# Patient Record
Sex: Male | Born: 1990 | Race: White | Hispanic: No | Marital: Single | Smoking: Current every day smoker
Health system: Southern US, Community
[De-identification: ages and names within clinical notes are randomized; demographics above are authoritative.]

## PROBLEM LIST (undated history)

## (undated) HISTORY — PX: DENTAL SURGERY: SHX609

---

## 2013-01-18 ENCOUNTER — Ambulatory Visit: Payer: Self-pay | Admitting: Urology

## 2013-01-29 ENCOUNTER — Emergency Department: Payer: Self-pay | Admitting: Emergency Medicine

## 2013-01-29 LAB — URINALYSIS, COMPLETE
Bilirubin,UR: NEGATIVE
Leukocyte Esterase: NEGATIVE
Nitrite: NEGATIVE
Ph: 7 (ref 4.5–8.0)
Protein: NEGATIVE
RBC,UR: 17 /HPF (ref 0–5)
Specific Gravity: 1.017 (ref 1.003–1.030)
Squamous Epithelial: NONE SEEN

## 2014-07-29 NOTE — H&P (Signed)
PATIENT NAME:  Vincent Maddox, Vincent Maddox MR#:  [id number] DATE OF BIRTH:  May 30, 1990  DATE OF ADMISSION:  01/18/2013  CHIEF COMPLAINT: Difficulty voiding and hematuria.   HISTORY OF PRESENT ILLNESS: Mr. Vincent Maddox is a 24 year old white male, who developed gross hematuria in August and difficulty voiding. He has had a greater than 1-year history of intermittent dysuria, particularly after sexual activity. He has significant lower urinary tract symptoms with an AUA symptom score of 26 and a quality of life score of 4. 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 He comes in now for cystoscopy with visual internal urethrotomy.   ALLERGIES: No drug allergies.   MEDICATIONS: No medications.   PAST SURGICAL HISTORY: No previous surgical procedures.   SOCIAL HISTORY: He smokes a pack a day and has a 15 pack-year history. He consumes 15 to 20 alcoholic beverages per week.   FAMILY HISTORY: Remarkable for parents with thyroid disease.   PAST AND CURRENT MEDICAL CONDITIONS:  1.  Attention deficit disorder.  2.  Anxiety.   REVIEW OF SYSTEMS: The patient occasionally has double vision. He has had intermittent low back pain for a year. He denied chest pain, shortness of breath, diabetes, stroke, heart disease or hypertension.   PHYSICAL EXAMINATION:  GENERAL: Thin white male in no acute distress.  HEENT: Sclerae were clear. Pupils were equally round and reactive to light and accommodation.  NECK: Supple. No palpable cervical adenopathy.  LUNGS: Clear to auscultation.  HEART: Regular rhythm and rate without audible murmurs.  ABDOMEN: Soft, nontender abdomen.  GENITOURINARY: Circumcised. Testes smooth, nontender, 20 mL in size each.  RECTAL: 15 gram smooth nontender prostate.  NEUROMUSCULAR: Alert and oriented x 3.   IMPRESSION: Urethral stricture.   PLAN: Cystoscopy with visual internal urethrotomy.   ____________________________ Suszanne ConnersMichael R. Evelene CroonWolff,  MD mrw:aw D: 01/13/2013 09:48:44 ET T: 01/13/2013 09:56:44 ET JOB#: [id number]  cc: Suszanne ConnersMichael R. Evelene CroonWolff, MD, <Dictator> Orson ApeMICHAEL R Zacchaeus Halm MD ELECTRONICALLY SIGNED 01/13/2013 13:35

## 2014-07-29 NOTE — Op Note (Signed)
PATIENT NAME:  Vincent Maddox, Ever MR#:  [id number] DATE OF BIRTH:  06-Feb-1991  DATE OF PROCEDURE:  01/18/2013  PREOPERATIVE DIAGNOSIS: Urethral stricture disease.   POSTOPERATIVE DIAGNOSIS; Urethral stricture disease.  PROCEDURE: Cystoscopy with internal urethrotomy using the holmium laser.   SURGEON: Anola GurneyMichael Deyja Sochacki, M.D.   ANESTHETIST: Dr. Pernell DupreAdams and Dr. Evelene CroonWolff.  ANESTHETIC METHOD: General per Dr. Pernell DupreAdams and local per Dr. Evelene CroonWolff.   INDICATIONS: See the dictated history and physical. After informed consent, the patient requests the above procedure.   OPERATIVE SUMMARY: After adequate general anesthesia had been obtained, the patient was placed into dorsal lithotomy position and the perineum was prepped and draped in the usual fashion. The 21-French cystoscope was coupled to the camera and then visually advanced into the urethra. A stricture was encountered at the distal bulbar urethra. The stricture extended to the level of the external sphincter. The stricture did not allow passage of the 21 scope through the stricture. At this point, a 0.035 guidewire was advanced through the scope and through the stricture and curled into the bladder. The 550 micron side-fire holmium laser fiber was then introduced through the scope and the strictures incised at the 12 o'clock position, eventually allowing passage of the scope into the bladder. The bladder was then thoroughly inspected. Both ureteral orifices were identified and had clear efflux. No bladder mucosal lesions  were identified. At this point, the scope was removed taking care of the guidewire in position. 10 mL of viscous Xylocaine was instilled within the urethra and the bladder. A 20-French Council catheter was advanced over the guidewire and positioned into the bladder. Guidewire was then removed. The catheter had clear drainage. A B and O suppository was placed. The procedure was then terminated and the patient was transferred to the recovery room in stable  condition.  ____________________________ Suszanne ConnersMichael R. Evelene CroonWolff, MD mrw:aw D: 01/18/2013 11:54:10 ET T: 01/18/2013 12:15:47 ET JOB#: [id number]  cc: Suszanne ConnersMichael R. Evelene CroonWolff, MD, <Dictator> Orson ApeMICHAEL R Yerlin Gasparyan MD ELECTRONICALLY SIGNED 01/19/2013 14:20

## 2014-10-07 IMAGING — US US PELVIS LIMITED
1 series · 14 of 25 positions shown · non-contrast
Comparison: none

REASON FOR EXAM: testicular pain s/p procedure last week, evaluate for
blood flow
COMMENTS:

PROCEDURE:     US  - US TESTICULAR  - January 29, 2013  [DATE]
RESULT:     History: Pain.
Comparison Study: No prior.

[Series 1: us pelvis limited · 0.08mm/px · 14 of 45 slices shown]
[im 1/45]
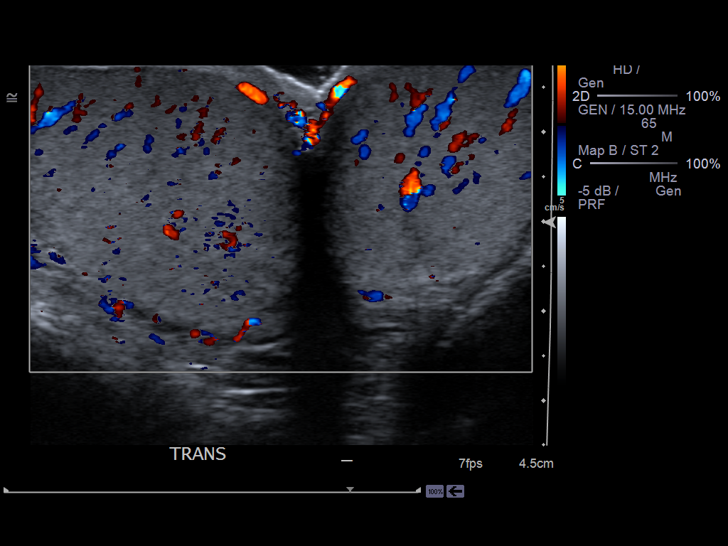
[im 4/45]
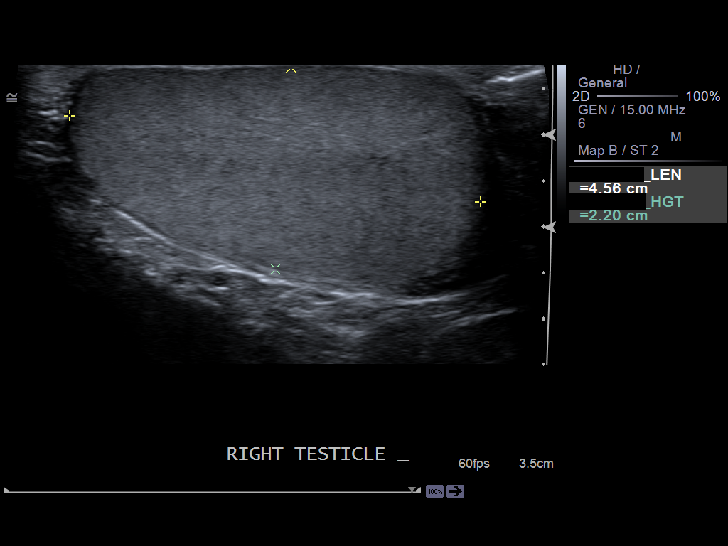
[im 8/45]
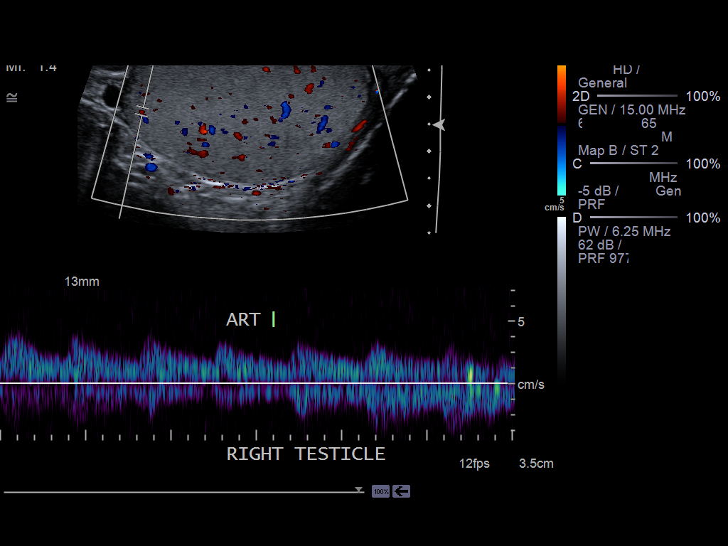
[im 12/45]
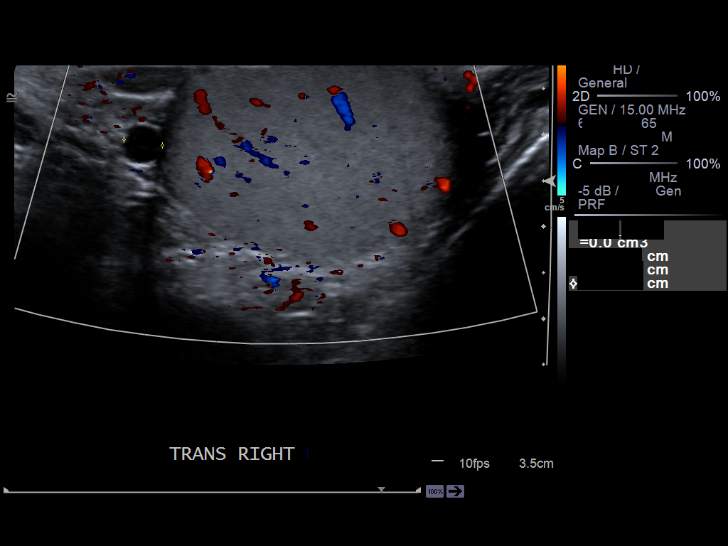
[im 15/45]
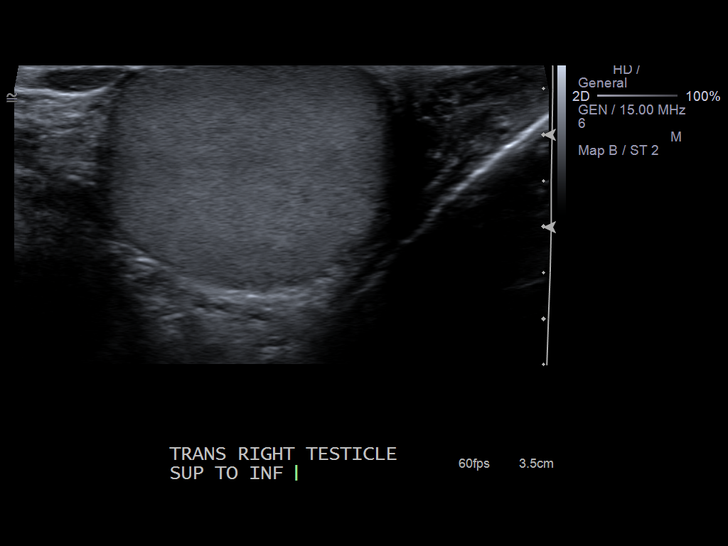
[im 17/45]
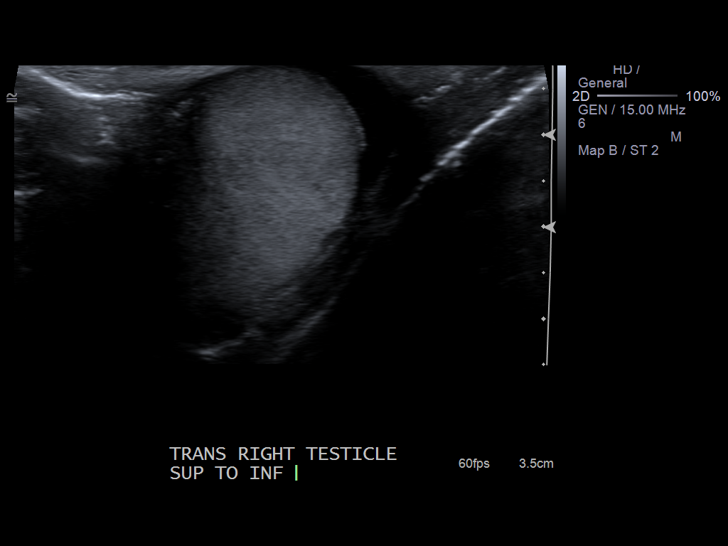
[im 21/45]
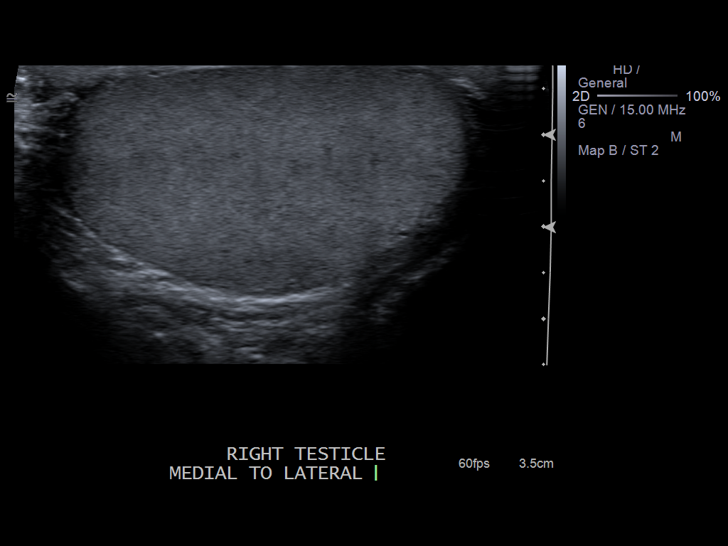
[im 24/45]
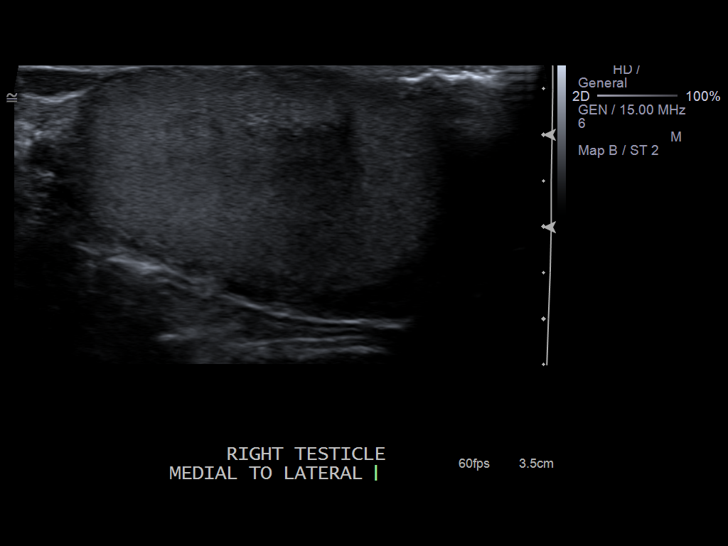
[im 28/45]
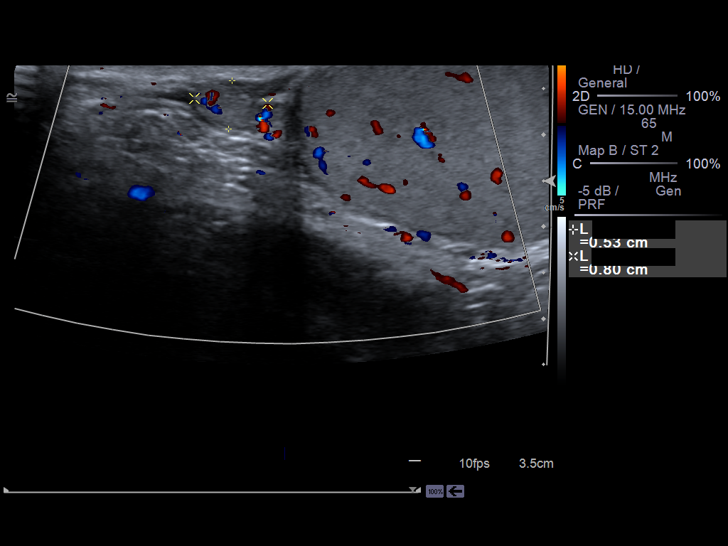
[im 30/45]
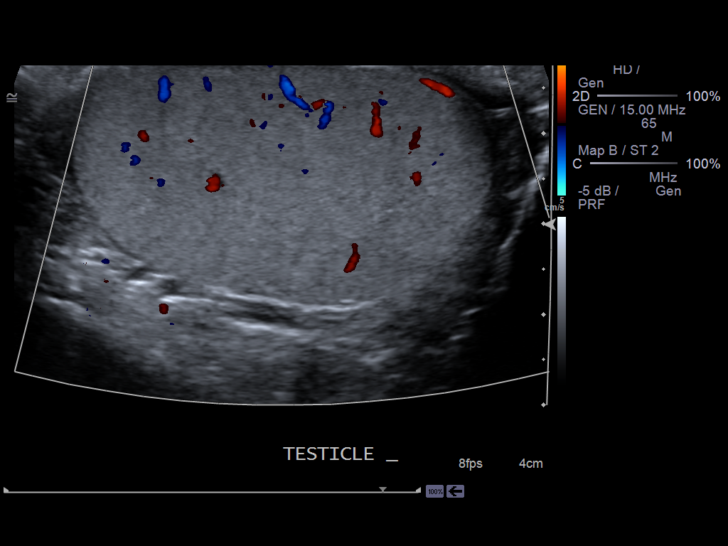
[im 34/45]
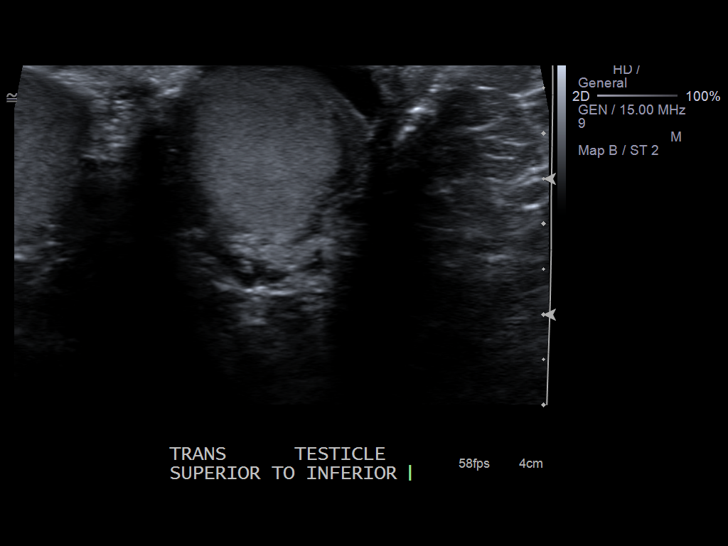
[im 37/45]
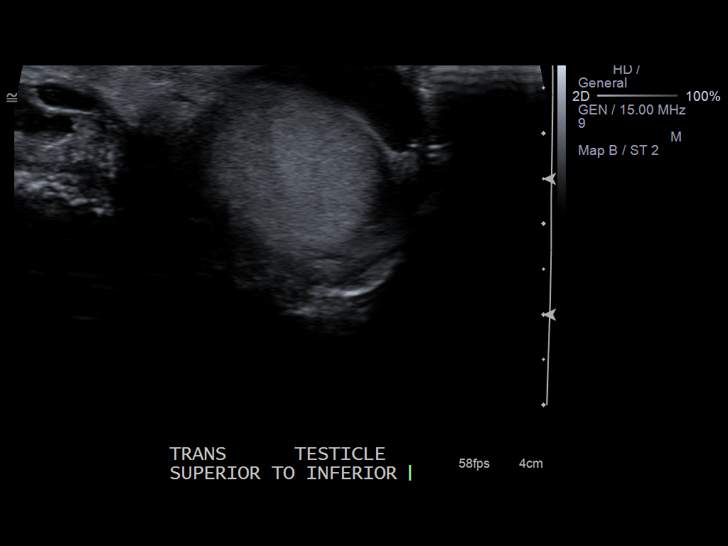
[im 41/45]
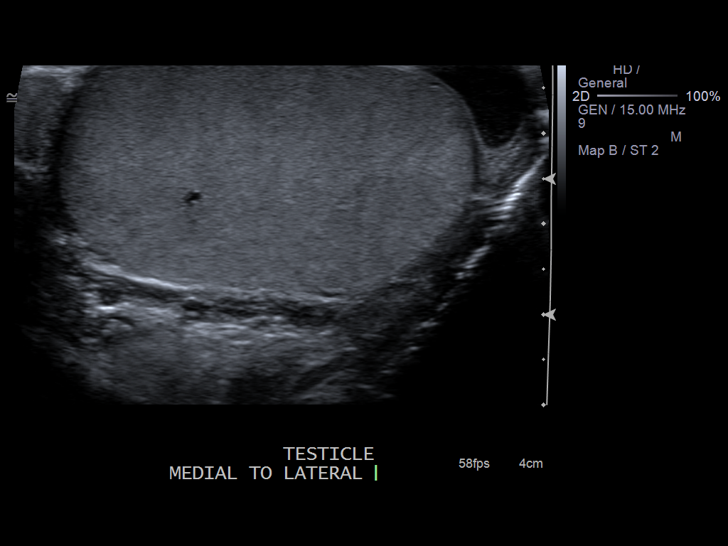
[im 45/45]
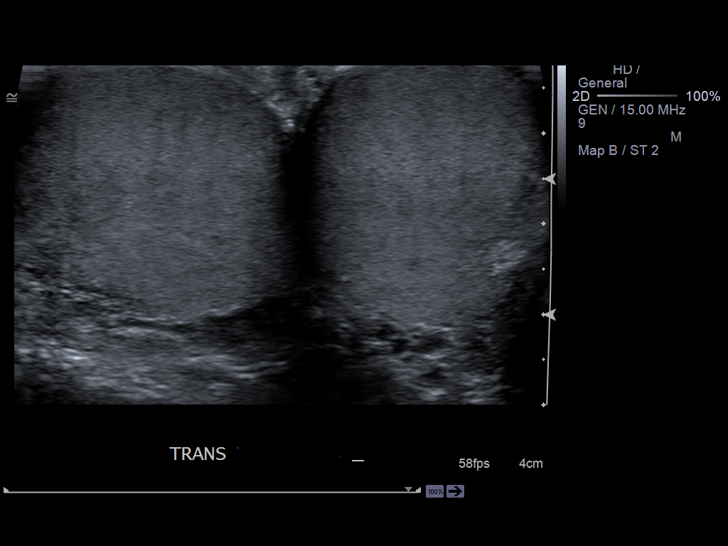

[14 of 25 positions shown; findings below may reference images not displayed]

FINDINGS: Standard testicular ultrasound with Doppler analysis performed.

Right testicle measures 4.6 cm in longitudinal length and appears normal. A
4.4 mm epididymal cyst is present. Epididymis otherwise unremarkable. Normal
testicular flow.

Left testicle measures 4.6 arteries longitudinal length and appears normal.
Small varicocele is present. Epididymis is normal. Normal flow present.
IMPRESSION: Tiny epididymal cyst on the right. Small varicocele on the
left. No torsion.

## 2015-12-23 ENCOUNTER — Encounter: Payer: Self-pay | Admitting: Emergency Medicine

## 2015-12-23 ENCOUNTER — Emergency Department: Payer: Self-pay

## 2015-12-23 ENCOUNTER — Emergency Department
Admission: EM | Admit: 2015-12-23 | Discharge: 2015-12-23 | Disposition: A | Discharge status: Home / Self Care | Payer: Self-pay | Attending: Emergency Medicine | Admitting: Emergency Medicine

## 2015-12-23 DIAGNOSIS — Y288XXA Contact with other sharp object, undetermined intent, initial encounter: Secondary | ICD-10-CM | POA: Insufficient documentation

## 2015-12-23 DIAGNOSIS — S41111A Laceration without foreign body of right upper arm, initial encounter: Secondary | ICD-10-CM

## 2015-12-23 DIAGNOSIS — F17200 Nicotine dependence, unspecified, uncomplicated: Secondary | ICD-10-CM | POA: Insufficient documentation

## 2015-12-23 DIAGNOSIS — Y9389 Activity, other specified: Secondary | ICD-10-CM | POA: Insufficient documentation

## 2015-12-23 DIAGNOSIS — Y999 Unspecified external cause status: Secondary | ICD-10-CM | POA: Insufficient documentation

## 2015-12-23 DIAGNOSIS — Z23 Encounter for immunization: Secondary | ICD-10-CM | POA: Insufficient documentation

## 2015-12-23 DIAGNOSIS — S51811A Laceration without foreign body of right forearm, initial encounter: Secondary | ICD-10-CM | POA: Insufficient documentation

## 2015-12-23 DIAGNOSIS — Y92018 Other place in single-family (private) house as the place of occurrence of the external cause: Secondary | ICD-10-CM | POA: Insufficient documentation

## 2015-12-23 MED ORDER — ONDANSETRON 4 MG PO TBDP
4.0000 mg | ORAL_TABLET | Freq: Once | ORAL | Status: AC
  Administered 2015-12-23: 4 mg via ORAL
  Filled 2015-12-23: qty 1

## 2015-12-23 MED ORDER — LIDOCAINE-EPINEPHRINE (PF) 1 %-1:200000 IJ SOLN
10.0000 mL | Freq: Once | INTRAMUSCULAR | Status: AC
  Administered 2015-12-23: 10 mL
  Filled 2015-12-23: qty 30

## 2015-12-23 MED ORDER — TETANUS-DIPHTH-ACELL PERTUSSIS 5-2.5-18.5 LF-MCG/0.5 IM SUSP
0.5000 mL | Freq: Once | INTRAMUSCULAR | Status: AC
  Administered 2015-12-23: 0.5 mL via INTRAMUSCULAR
  Filled 2015-12-23: qty 0.5

## 2015-12-23 MED ORDER — HYDROCODONE-ACETAMINOPHEN 5-325 MG PO TABS: 1.0000 | ORAL_TABLET | Freq: Four times a day (QID) | ORAL | 0 refills | Status: DC | PRN

## 2015-12-23 MED ORDER — SULFAMETHOXAZOLE-TRIMETHOPRIM 800-160 MG PO TABS: 1.0000 | ORAL_TABLET | Freq: Two times a day (BID) | ORAL | 0 refills | Status: DC
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
## 2015-12-23 NOTE — ED Notes (Signed)
Pt verbalized understanding of discharge instructions. NAD at this time. 
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
## 2015-12-23 NOTE — Discharge Instructions (Signed)
Keep wounds clean and dry for 48 hours then may cleanse per usual.

## 2016-01-04 DIAGNOSIS — Z7185 Encounter for immunization safety counseling: Secondary | ICD-10-CM | POA: Insufficient documentation

## 2017-08-30 IMAGING — CR DG FOREARM 2V*R*
1 series · 2 of 2 positions shown · non-contrast
Comparison: None.

CLINICAL DATA: Laceration injury to right forearm. Evaluate for
foreign bodies. Initial encounter.

EXAM:
RIGHT FOREARM - 2 VIEW

[Series 1: x forearm ap right · 0.14mm/px · 2 of 2 slices shown]
[im 1/2]
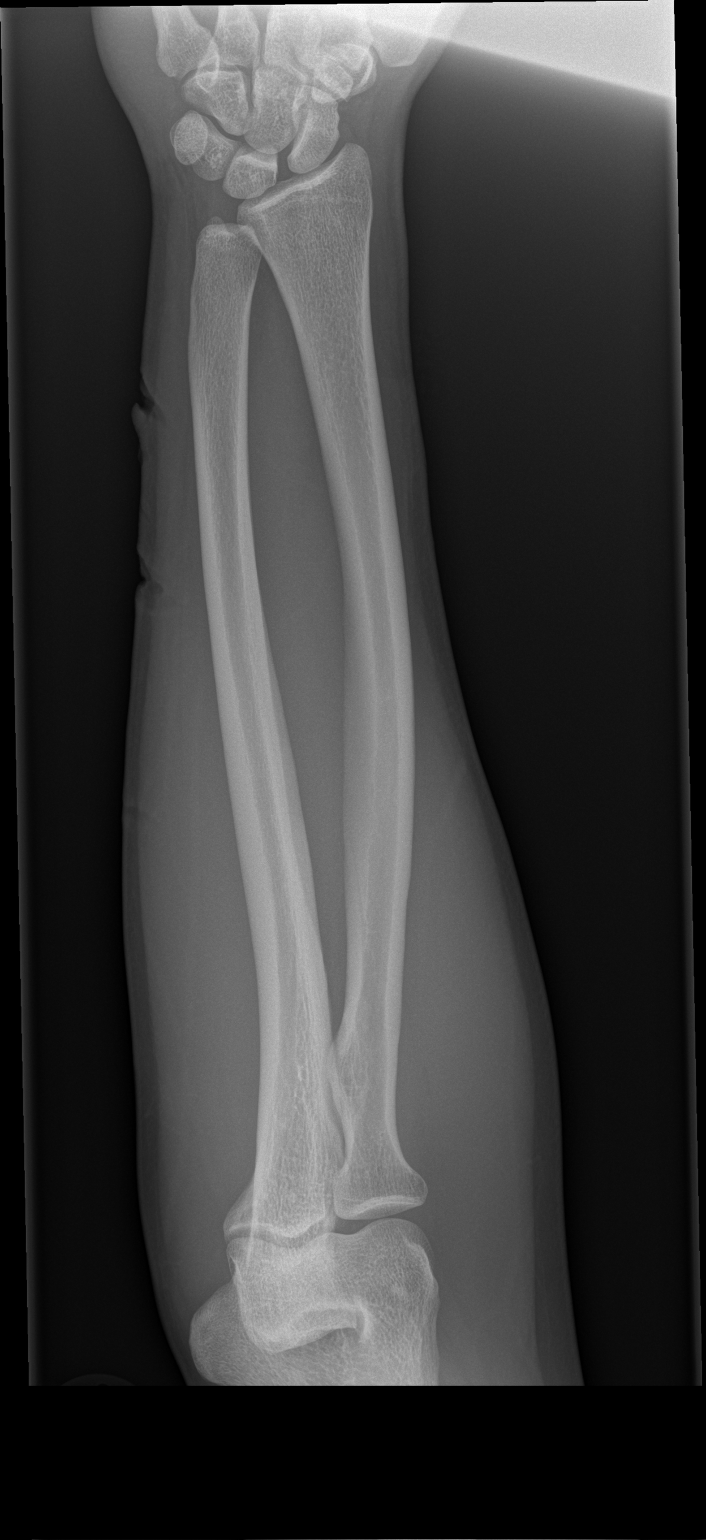
[im 2/2]
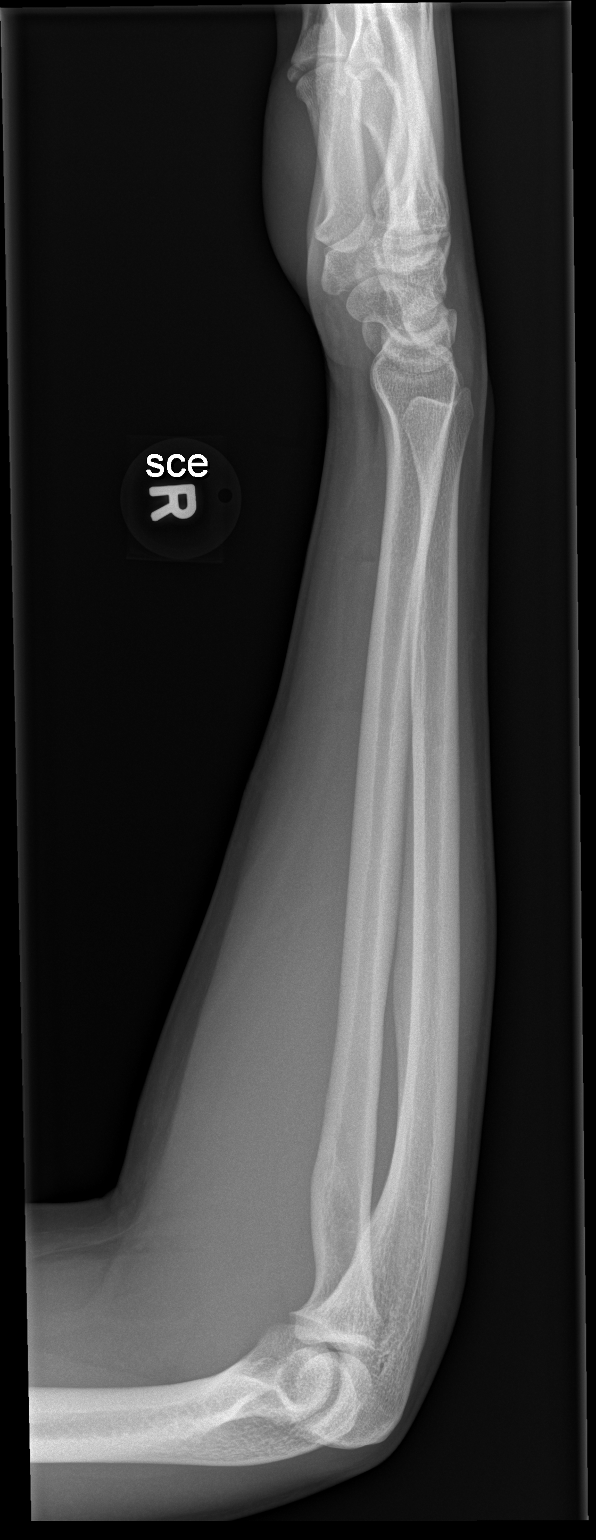

[2 of 2 positions shown; findings below may reference images not displayed]

FINDINGS: Several soft tissue defects along the medial right forearm
identified.

No acute fracture, subluxation or dislocation identified.

No radiopaque foreign bodies are identified.

No focal bony lesions are present.
IMPRESSION: Soft tissue injuries without evidence of bony abnormality or
radiopaque foreign bodies.

## 2020-11-13 ENCOUNTER — Telehealth: Payer: Self-pay | Admitting: Certified Nurse Midwife

## 2020-11-13 NOTE — Telephone Encounter (Signed)
Vincent Maddox with [facility]ox called and stated she has called multiple times.  She is requesting the requisition to be faxed to [phone] for Case [id number]. Wife which is our pt Vincent Maddox

## 2021-06-20 ENCOUNTER — Ambulatory Visit: Payer: Self-pay | Admitting: Podiatry

## 2023-05-14 ENCOUNTER — Other Ambulatory Visit: Payer: Self-pay | Admitting: *Deleted

## 2023-05-14 DIAGNOSIS — R1084 Generalized abdominal pain: Secondary | ICD-10-CM

## 2023-05-22 ENCOUNTER — Other Ambulatory Visit: Payer: Self-pay

## 2023-05-27 ENCOUNTER — Ambulatory Visit
Admission: RE | Admit: 2023-05-27 | Discharge: 2023-05-27 | Disposition: A | Discharge status: Home / Self Care | Payer: BLUE CROSS/BLUE SHIELD | Source: Ambulatory Visit | Attending: *Deleted | Admitting: *Deleted

## 2023-05-27 DIAGNOSIS — R1084 Generalized abdominal pain: Secondary | ICD-10-CM

## 2023-05-30 ENCOUNTER — Encounter: Payer: Self-pay | Admitting: *Deleted

## 2023-05-30 ENCOUNTER — Other Ambulatory Visit: Payer: Self-pay | Admitting: *Deleted

## 2023-05-30 DIAGNOSIS — R198 Other specified symptoms and signs involving the digestive system and abdomen: Secondary | ICD-10-CM

## 2023-06-02 ENCOUNTER — Other Ambulatory Visit: Payer: BLUE CROSS/BLUE SHIELD

## 2023-10-03 ENCOUNTER — Other Ambulatory Visit: Payer: Self-pay

## 2023-10-03 DIAGNOSIS — Z021 Encounter for pre-employment examination: Secondary | ICD-10-CM

## 2023-10-15 ENCOUNTER — Ambulatory Visit: Payer: Self-pay

## 2023-10-15 ENCOUNTER — Encounter: Payer: Self-pay | Admitting: Physician Assistant

## 2023-10-15 ENCOUNTER — Ambulatory Visit: Payer: Self-pay | Admitting: Physician Assistant

## 2023-10-15 VITALS — BP 130/79 | HR 71 | Temp 98.4°F | Resp 16 | Ht 74.0 in | Wt 230.0 lb

## 2023-10-15 DIAGNOSIS — Z021 Encounter for pre-employment examination: Secondary | ICD-10-CM

## 2023-10-15 LAB — POCT URINALYSIS DIPSTICK
Bilirubin, UA: NEGATIVE
Blood, UA: NEGATIVE
Glucose, UA: NEGATIVE
Ketones, UA: NEGATIVE
Leukocytes, UA: NEGATIVE
Nitrite, UA: NEGATIVE
Protein, UA: NEGATIVE
Spec Grav, UA: 1.015 (ref 1.010–1.025)
Urobilinogen, UA: 0.2 U/dL
pH, UA: 7 (ref 5.0–8.0)

## 2023-10-15 NOTE — Progress Notes (Signed)
 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  Pt takes Bpc 2000mg  per ml .2cc daily. Medications would not let me input this information.

## 2023-10-15 NOTE — Progress Notes (Signed)
   [facility]    None    (approximate)  I have reviewed the triage vital signs and the nursing notes.   HISTORY  Chief Complaint No chief complaint on file.   HPI Vincent Maddox is a 33 y.o. male patient presents for preemployment physical for the [employer].        No past medical history on file.  Patient Active Problem List   Diagnosis Date Noted   Vaccine counseling 01/04/2016      Prior to Admission medications   Medication Sig Start Date End Date Taking? Authorizing Provider  sulfamethoxazole -trimethoprim  (BACTRIM  DS,SEPTRA  DS) 800-160 MG tablet Take 1 tablet by mouth 2 (two) times daily. 12/23/15   Hagler, Jami L, PA-C    Allergies Patient has no known allergies.  No family history on file.  Social History Social History   Tobacco Use   Smoking status: Every Day  Substance Use Topics   Alcohol use: Yes   Drug use: No    Review of Systems Constitutional: No fever/chills Eyes: No visual changes. ENT: No sore throat. Cardiovascular: Denies chest pain. Respiratory: Denies shortness of breath. Gastrointestinal: No abdominal pain.  No nausea, no vomiting.  No diarrhea.  No constipation. Genitourinary: Negative for dysuria. Musculoskeletal: Negative for back pain. Skin: Negative for rash. Neurological: Negative for headaches, focal weakness or numbness.   ____________________________________________   PHYSICAL EXAM:  VITAL SIGNS:  Constitutional: Alert and oriented. Well appearing and in no acute distress. Eyes: Conjunctivae are normal. PERRL. EOMI. Head: Atraumatic. BP 130/79  Cuff Size Large  Pulse Rate 71  Temp 98.4 F (36.9 C)  Temp Source Temporal  Weight 230 lb (104.3 kg)  Height 6' 2 (1.88 m)  Resp 16  SpO2 96 %   BMI: 29.53 kg/m2  BSA: 2.33 m2   Nose: No congestion/rhinnorhea. Mouth/Throat: Mucous membranes are moist.  Oropharynx non-erythematous. Neck: No stridor.  No  cervical spine tenderness to palpation. Hematological/Lymphatic/Immunilogical: No cervical lymphadenopathy. Cardiovascular: Normal rate, regular rhythm. Grossly normal heart sounds.  Good peripheral circulation. Respiratory: Normal respiratory effort.  No retractions. Lungs CTAB. Gastrointestinal: Soft and nontender. No distention. No abdominal bruits. No CVA tenderness. Genitourinary: Deferred Musculoskeletal: No lower extremity tenderness nor edema.  No joint effusions. Neurologic:  Normal speech and language. No gross focal neurologic deficits are appreciated. No gait instability. Skin:  Skin is warm, dry and intact. No rash noted. Psychiatric: Mood and affect are normal. Speech and behavior are normal.  ____________________________________________   LABS Pending ____________________________________________  EKG  Sinus rhythm at 62 bpm.  Right incomplete right bundle branch block. ____________________________________________   ____________________________________________   INITIAL IMPRESSION / ASSESSMENT AND PLAN  As part of my medical decision making, I reviewed the following data within the electronic MEDICAL RECORD NUMBER       No acute findings on physical exam or EKG.  Labs are pending.      ____________________________________________   FINAL CLINICAL IMPRESSION  Well exam  ED Discharge Orders     None        Note:  This document was prepared using Dragon voice recognition software and may include unintentional dictation errors.

## 2023-10-15 NOTE — Progress Notes (Signed)
 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 Vincent Maddox

## 2023-10-16 ENCOUNTER — Other Ambulatory Visit: Payer: Self-pay

## 2023-10-16 DIAGNOSIS — Z021 Encounter for pre-employment examination: Secondary | ICD-10-CM

## 2023-10-16 NOTE — Progress Notes (Signed)
 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 Vincent Maddox

## 2023-10-17 LAB — CMP12+LP+TP+TSH+6AC+CBC/D/PLT
ALT: 22 IU/L (ref 0–44)
AST: 20 IU/L (ref 0–40)
Albumin: 4.6 g/dL (ref 4.1–5.1)
Alkaline Phosphatase: 86 IU/L (ref 44–121)
BUN/Creatinine Ratio: 13 (ref 9–20)
BUN: 14 mg/dL (ref 6–20)
Basophils Absolute: 0.1 x10E3/uL (ref 0.0–0.2)
Basos: 1 %
Bilirubin Total: 0.5 mg/dL (ref 0.0–1.2)
Calcium: 9.4 mg/dL (ref 8.7–10.2)
Chloride: 101 mmol/L (ref 96–106)
Chol/HDL Ratio: 3.8 ratio (ref 0.0–5.0)
Cholesterol, Total: 164 mg/dL (ref 100–199)
Creatinine, Ser: 1.09 mg/dL (ref 0.76–1.27)
EOS (ABSOLUTE): 0 x10E3/uL (ref 0.0–0.4)
Eos: 1 %
Estimated CHD Risk: 0.7 times avg. (ref 0.0–1.0)
Free Thyroxine Index: 1.6 (ref 1.2–4.9)
GGT: 14 IU/L (ref 0–65)
Globulin, Total: 2.4 g/dL (ref 1.5–4.5)
Glucose: 93 mg/dL (ref 70–99)
HDL: 43 mg/dL (ref >39)
Hematocrit: 47.9 % (ref 37.5–51.0)
Hemoglobin: 15.7 g/dL (ref 13.0–17.7)
Immature Grans (Abs): 0 x10E3/uL (ref 0.0–0.1)
Immature Granulocytes: 0 %
Iron: 117 ug/dL (ref 38–169)
LDH: 168 IU/L (ref 121–224)
LDL Chol Calc (NIH): 101 mg/dL — ABNORMAL HIGH (ref 0–99)
Lymphocytes Absolute: 1.9 x10E3/uL (ref 0.7–3.1)
Lymphs: 25 %
MCH: 29.4 pg (ref 26.6–33.0)
MCHC: 32.8 g/dL (ref 31.5–35.7)
MCV: 90 fL (ref 79–97)
Monocytes Absolute: 0.7 x10E3/uL (ref 0.1–0.9)
Monocytes: 9 %
Neutrophils Absolute: 4.8 x10E3/uL (ref 1.4–7.0)
Neutrophils: 64 %
Phosphorus: 2.2 mg/dL — ABNORMAL LOW (ref 2.8–4.1)
Platelets: 247 x10E3/uL (ref 150–450)
Potassium: 4.2 mmol/L (ref 3.5–5.2)
RBC: 5.34 x10E6/uL (ref 4.14–5.80)
RDW: 12.8 % (ref 11.6–15.4)
Sodium: 136 mmol/L (ref 134–144)
T3 Uptake Ratio: 25 % (ref 24–39)
T4, Total: 6.3 ug/dL (ref 4.5–12.0)
TSH: 2.84 u[IU]/mL (ref 0.450–4.500)
Total Protein: 7 g/dL (ref 6.0–8.5)
Triglycerides: 111 mg/dL (ref 0–149)
Uric Acid: 6.4 mg/dL (ref 3.8–8.4)
VLDL Cholesterol Cal: 20 mg/dL (ref 5–40)
WBC: 7.4 x10E3/uL (ref 3.4–10.8)
eGFR: 92 mL/min/1.73 (ref >59)

## 2023-10-17 LAB — QUANTIFERON-TB GOLD PLUS

## 2023-10-21 LAB — HEPATITIS B SURFACE ANTIBODY,QUALITATIVE: Hep B Surface Ab, Qual: NONREACTIVE

## 2023-10-21 LAB — SPECIMEN STATUS REPORT
